# Patient Record
Sex: Male | Born: 2000 | Race: White | Hispanic: No | Marital: Single | Smoking: Never smoker
Health system: Southern US, Community
[De-identification: ages and names within clinical notes are randomized; demographics above are authoritative.]

## PROBLEM LIST (undated history)

## (undated) HISTORY — PX: OTHER SURGICAL HISTORY: SHX169

## (undated) HISTORY — PX: FRACTURE SURGERY: SHX138

---
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
## 2024-01-28 ENCOUNTER — Ambulatory Visit
Admission: RE | Admit: 2024-01-28 | Discharge: 2024-01-28 | Disposition: A | Discharge status: Home / Self Care | Source: Ambulatory Visit

## 2024-01-28 VITALS — BP 125/73 | HR 65 | Temp 98.1°F | Resp 15 | Ht 67.0 in | Wt 210.1 lb

## 2024-01-28 DIAGNOSIS — S86111A Strain of other muscle(s) and tendon(s) of posterior muscle group at lower leg level, right leg, initial encounter: Secondary | ICD-10-CM | POA: Diagnosis not present

## 2024-01-28 DIAGNOSIS — S8011XA Contusion of right lower leg, initial encounter: Secondary | ICD-10-CM | POA: Diagnosis not present

## 2024-01-28 NOTE — ED Provider Notes (Signed)
 [facility]    CSN: [id number] Arrival date & time: 01/28/24  1328      History   Chief Complaint Chief Complaint  Patient presents with   Leg Injury    Appointment    HPI Joel Shepard is a 23 y.o. male presents for bruising and swelling of the posterior medial right knee since yesterday. He reports an injury that occurred when he was working out. He states he was jumping down from pulling himself up on a pull up bar and he feet hit the bottom bar. He says within 30-60 min he noticed the pain and swelling. Symptoms are not as bad today. He is not limping. He says the area only really hurts when it is touched. Full ROM of knee. He has not treated condition.   HPI  History reviewed. No pertinent past medical history.  There are no active problems to display for this patient.   Past Surgical History:  Procedure Laterality Date   arm surgery         Home Medications    Prior to Admission medications   Medication Sig Start Date End Date Taking? Authorizing Provider  meloxicam (MOBIC) 7.5 MG tablet Take 1 tablet (7.5 mg total) by mouth daily. Do not take w/motrin alleve or excedrin 05/21/16   Desiderio Beagle, MD    Family History History reviewed. No pertinent family history.  Social History Social History   Tobacco Use   Smoking status: Never   Smokeless tobacco: Never  Vaping Use   Vaping status: Never Used  Substance Use Topics   Alcohol use: No   Drug use: No     Allergies   Patient has no known allergies.   Review of Systems Review of Systems  Musculoskeletal:  Positive for joint swelling. Negative for arthralgias and gait problem.  Skin:  Positive for color change. Negative for wound.  Neurological:  Negative for weakness and numbness.  Hematological:  Does not bruise/bleed easily.     Physical Exam Triage Vital Signs ED Triage Vitals  Encounter Vitals Group     BP 01/28/24 1351 125/73     Girls Systolic BP Percentile --       Girls Diastolic BP Percentile --      Boys Systolic BP Percentile --      Boys Diastolic BP Percentile --      Pulse Rate 01/28/24 1351 65     Resp 01/28/24 1351 15     Temp 01/28/24 1351 98.1 F (36.7 C)     Temp Source 01/28/24 1351 Oral     SpO2 01/28/24 1351 97 %     Weight 01/28/24 1349 210 lb 1.6 oz (95.3 kg)     Height 01/28/24 1349 5' 7 (1.702 m)     Head Circumference --      Peak Flow --      Pain Score 01/28/24 1349 3     Pain Loc --      Pain Education --      Exclude from Growth Chart --    No data found.  Updated Vital Signs BP 125/73 (BP Location: Left Arm)   Pulse 65   Temp 98.1 F (36.7 C) (Oral)   Resp 15   Ht 5' 7 (1.702 m)   Wt 210 lb 1.6 oz (95.3 kg)   SpO2 97%   BMI 32.91 kg/m     Physical Exam Vitals and nursing note reviewed.  Constitutional:  General: He is not in acute distress.    Appearance: Normal appearance. He is well-developed. He is not ill-appearing.  HENT:     Head: Normocephalic and atraumatic.  Eyes:     General: No scleral icterus.    Conjunctiva/sclera: Conjunctivae normal.  Cardiovascular:     Rate and Rhythm: Normal rate.  Pulmonary:     Effort: Pulmonary effort is normal. No respiratory distress.  Musculoskeletal:     Cervical back: Neck supple.     Comments: RIGHT LEG: There is mild swelling/contusion of the medial posterior knee/medial superior gastrocnemius. No bony tenderness. Full ROM of knee. No gait abnormality.   Skin:    General: Skin is warm and dry.     Capillary Refill: Capillary refill takes less than 2 seconds.  Neurological:     General: No focal deficit present.     Mental Status: He is alert. Mental status is at baseline.     Motor: No weakness.     Gait: Gait normal.  Psychiatric:        Mood and Affect: Mood normal.        Behavior: Behavior normal.      UC Treatments / Results  Labs (all labs ordered are listed, but only abnormal results are displayed) Labs Reviewed - No data to  display  EKG   Radiology No results found.  Procedures Procedures (including critical care time)  Medications Ordered in UC Medications - No data to display  Initial Impression / Assessment and Plan / UC Course  I have reviewed the triage vital signs and the nursing notes.  Pertinent labs & imaging results that were available during my care of the patient were reviewed by me and considered in my medical decision making (see chart for details).   23 year old male presents for pain and swelling as well as contusion of the medial posterior right knee.  Symptoms began after accidentally coming down hard on his feet on a metal bar while working out yesterday.  No significant swelling and no gait abnormality.  There is mild contusion and swelling of the medial posterior right knee and superior medial gastrocnemius.  Area mildly tender.  Strain of gastrocnemius muscle with contusion.  Advised cryotherapy, Tylenol, Motrin, compressive bandage.  Reviewed return precautions.   Final Clinical Impressions(s) / UC Diagnoses   Final diagnoses:  Contusion of right lower leg, initial encounter  Gastrocnemius muscle strain, right, initial encounter     Discharge Instructions      STRAIN: Stressed avoiding painful activities . Reviewed RICE guidelines. Use medications as directed, including NSAIDs. If no NSAIDs have been prescribed for you today, you may take Aleve or Motrin over the counter. May use Tylenol in between doses of NSAIDs.  If no improvement in the next 1-2 weeks, f/u with PCP or return to our office for reexamination, and please feel free to call or return at any time for any questions or concerns you may have and we will be happy to help you!        ED Prescriptions   None    PDMP not reviewed this encounter.   Arvis Jolan NOVAK, PA-C 01/28/24 1420

## 2024-01-28 NOTE — ED Triage Notes (Signed)
 Patient states that he was working up yesterday and was coming down from a pull up and his heels hit the bottom bar.  Patient states that when he was done he noticed a bulge or swelling at the back of his right knee.  Patient reports tenderness at the spot.  Patient reports full ROM.

## 2024-01-28 NOTE — Discharge Instructions (Addendum)
STRAIN: Stressed avoiding painful activities . Reviewed RICE guidelines. Use medications as directed, including NSAIDs. If no NSAIDs have been prescribed for you today, you may take Aleve or Motrin over the counter. May use Tylenol in between doses of NSAIDs.  If no improvement in the next 1-2 weeks, f/u with PCP or return to our office for reexamination, and please feel free to call or return at any time for any questions or concerns you may have and we will be happy to help you!     

## 2024-03-14 DIAGNOSIS — L700 Acne vulgaris: Secondary | ICD-10-CM | POA: Diagnosis not present

## 2024-03-14 DIAGNOSIS — L853 Xerosis cutis: Secondary | ICD-10-CM | POA: Diagnosis not present

## 2024-03-14 DIAGNOSIS — D485 Neoplasm of uncertain behavior of skin: Secondary | ICD-10-CM | POA: Diagnosis not present

## 2024-03-14 DIAGNOSIS — L720 Epidermal cyst: Secondary | ICD-10-CM | POA: Diagnosis not present

## 2024-04-18 ENCOUNTER — Ambulatory Visit: Admitting: Family Medicine

## 2024-04-18 VITALS — BP 114/76 | HR 81 | Resp 16 | Ht 67.0 in | Wt 205.0 lb

## 2024-04-18 DIAGNOSIS — M25531 Pain in right wrist: Secondary | ICD-10-CM

## 2024-04-18 DIAGNOSIS — S83002S Unspecified subluxation of left patella, sequela: Secondary | ICD-10-CM | POA: Diagnosis not present

## 2024-04-18 DIAGNOSIS — Z7689 Persons encountering health services in other specified circumstances: Secondary | ICD-10-CM

## 2024-04-18 NOTE — Patient Instructions (Addendum)
 [facility] [address] [place], [place] [address] Phone: [phone]   MyChart:  For all urgent or time sensitive needs we ask that you please call the office to avoid delays. Our number is [phone]) [phone]. MyChart is not constantly monitored and due to the large volume of messages a day, replies may take up to 72 business hours.   MyChart Policy: MyChart allows for you to see your visit notes, after visit summary, provider recommendations, lab and tests results, make an appointment, request refills, and contact your provider or the office for non-urgent questions or concerns. Providers are seeing patients during normal business hours and do not have built in time to review MyChart messages.  We ask that you allow a minimum of 3 business days for responses to [place]. For this reason, please do not send urgent requests through MyChart. Please call the office at [phone]. New and ongoing conditions may require a visit. We have virtual and in person visit available for your convenience.  Complex MyChart concerns may require a visit. Your provider may request you schedule a virtual or in person visit to ensure we are providing the best care possible. MyChart messages sent after 11:00 AM on Friday will not be received by the provider until Monday morning.    Lab and Test Results: You will receive your lab and test results on MyChart as soon as they are completed and results have been sent by the lab or testing facility. Due to this service, you will receive your results BEFORE your provider.  I review lab and tests results each morning prior to seeing patients. Some results require collaboration with other providers to ensure you are receiving the most appropriate care. For this reason, we ask that you please allow a minimum of 3-5 business days from the time the ALL results have been received for your provider to receive and review lab and test results and  contact you about these.  Most lab and test result comments from the provider will be sent through MyChart. Your provider may recommend changes to the plan of care, follow-up visits, repeat testing, ask questions, or request an office visit to discuss these results. You may reply directly to this message or call the office at [phone] to provide information for the provider or set up an appointment. In some instances, you will be called with test results and recommendations. Please let us  know if this is preferred and we will make note of this in your chart to provide this for you.    If you have not heard a response to your lab or test results in 5 business days from all results returning to MyChart, please call the office to let us  know. We ask that you please avoid calling prior to this time unless there is an emergent concern. Due to high call volumes, this can delay the resulting process.   After Hours: For all non-emergency after hours needs, please call the office at [phone] and select the option to reach the on-call provider service. On-call services are shared between multiple [facility] offices and therefore it will not be possible to speak directly with your provider. On-call providers may provide medical advice and recommendations, but are unable to provide refills for maintenance medications.  For all emergency or urgent medical needs after normal business hours, we recommend that you seek care at the closest Urgent Care or Emergency Department to ensure appropriate treatment in a timely  manner.  [facility] has a 24 hour emergency room located on the ground floor for your convenience.    Urgent Concerns During the Business Day Providers are seeing patients from 8AM to 5PM, Monday through Thursday, and 8AM to 12PM on Friday with a busy schedule and are most often not able to respond to non-urgent calls until the end of the day or the next business day. If  you should have URGENT concerns during the day, please call and speak to the nurse or schedule a same day appointment so that we can address your concern without delay.    Thank you, again, for choosing me as your health care partner. I appreciate your trust and look forward to learning more about you.    Evalene Arts, FNP-C

## 2024-04-18 NOTE — Progress Notes (Addendum)
 New Patient Office Visit  Subjective   Patient ID: Joel Shepard, male    DOB: Oct 30, 2000  Age: 23 y.o. MRN: [id number]  CC:  Chief Complaint  Patient presents with   Establish Care    Left Knee Cap pops out of place since childhood and has always been weak. Never did PT and now having more popping and feels knot when he stands. Had fluid in past as well.  Right Wrist injury when his knee popped out of place causing a fall a few weeks ago- Never got it checked.   Discussed the use of AI scribe software for clinical note transcription with the patient, who gave verbal consent to proceed.  History of Present Illness   Joel Shepard is a 23 year old male who presents to establish with [facility] at [facility].  He has concerns about his left knee and recent right wrist pain.  He has a history of left knee issues that began in eighth grade, around the age of 3. In 2019, he sustained a fracture of the middle part of his left patella but did not undergo physical therapy. Review of note- looks like he was diagnosed with subacute fracture of the medial patella associated with contusion of the lateral femoral condyle. The knee had been stable for the past year to year and a half until a recent incident while playing tennis, during which his patella dislocated and then relocated, causing him to fall. He notes swelling after such incidents, with the first occurrence being particularly severe. He experiences a sensation of effusion and occasional crepitus, especially when squatting. No current pain, but bending and squatting were painful immediately after the incident.  Following the fall during tennis, he experienced pain in his right wrist, which he used to catch himself. Initially, the wrist felt fine, but pain developed three to four hours later, particularly near the thumb. The pain is exacerbated by certain movements, such as extending the thumb, and he notes difficulty using a  pull-up bar due to the discomfort. He rates the pain as mild today, indicating improvement. He has not used ice or anti-inflammatory medications for the wrist pain. Reports pain in the right wrist, particularly near the thumb, with certain movements.     Outpatient Encounter Medications as of 04/18/2024  Medication Sig   [DISCONTINUED] doxycycline (ADOXA) 100 MG tablet Take 100 mg by mouth 2 (two) times daily.   [DISCONTINUED] meloxicam  (MOBIC ) 7.5 MG tablet Take 1 tablet (7.5 mg total) by mouth daily. Do not take w/motrin alleve or excedrin (Patient not taking: Reported on 04/18/2024)   No facility-administered encounter medications on file as of 04/18/2024.   There are no active problems to display for this patient.  History reviewed. No pertinent past medical history. Past Surgical History:  Procedure Laterality Date   arm surgery     FRACTURE SURGERY     Family History  Problem Relation Age of Onset   ADD / ADHD Mother    Cancer Mother    Diabetes Mother    Cancer Maternal Grandmother    ADD / ADHD Sister    Diabetes Maternal Uncle    Social History   Socioeconomic History   Marital status: Single    Spouse name: Not on file   Number of children: Not on file   Years of education: Not on file   Highest education level: 12th grade  Occupational History   Not on file  Tobacco Use  Smoking status: Never    Passive exposure: Never   Smokeless tobacco: Never  Vaping Use   Vaping status: Never Used  Substance and Sexual Activity   Alcohol use: No   Drug use: No   Sexual activity: Not Currently    Birth control/protection: Implant    Comment: she had an implant  Other Topics Concern   Not on file  Social History Narrative   ** Merged History Encounter **       Social Drivers of Health   Financial Resource Strain: Medium Risk (04/18/2024)   Overall Financial Resource Strain (CARDIA)    Difficulty of Paying Living Expenses: Somewhat hard  Food Insecurity: No Food  Insecurity (04/18/2024)   Hunger Vital Sign    Worried About Running Out of Food in the Last Year: Never true    Joel Out of Food in the Last Year: Never true  Transportation Needs: No Transportation Needs (04/18/2024)   PRAPARE - Administrator, Civil Service (Medical): No    Lack of Transportation (Non-Medical): No  Physical Activity: Sufficiently Active (04/18/2024)   Exercise Vital Sign    Days of Exercise per Week: 5 days    Minutes of Exercise per Session: 90 min  Stress: No Stress Concern Present (04/18/2024)   [employer] of Occupational Health - Occupational Stress Questionnaire    Feeling of Stress: Not at all  Social Connections: Socially Isolated (04/18/2024)   Social Connection and Isolation Panel    Frequency of Communication with Friends and Family: Three times a week    Frequency of Social Gatherings with Friends and Family: Twice a week    Attends Religious Services: Never    Database Administrator or Organizations: No    Attends Engineer, Structural: Not on file    Marital Status: Never married  Intimate Partner Violence: Not on file   Outpatient Medications Prior to Visit  Medication Sig Dispense Refill   doxycycline (ADOXA) 100 MG tablet Take 100 mg by mouth 2 (two) times daily.     meloxicam  (MOBIC ) 7.5 MG tablet Take 1 tablet (7.5 mg total) by mouth daily. Do not take w/motrin alleve or excedrin (Patient not taking: Reported on 04/18/2024) 30 tablet 0   No facility-administered medications prior to visit.   No Known Allergies  ROS: see HPI    Objective   Today's Vitals   04/18/24 1045  BP: 114/76  Pulse: 81  Resp: 16  SpO2: 99%  Weight: 205 lb (93 kg)  Height: 5' 7 (1.702 m)  PainSc: 5   PainLoc: Wrist   Physical Exam Vitals reviewed.  Constitutional:      Appearance: Normal appearance.  Cardiovascular:     Rate and Rhythm: Normal rate and regular rhythm.     Pulses: Normal pulses.     Heart sounds: Normal heart sounds.   Pulmonary:     Effort: Pulmonary effort is normal.     Breath sounds: Normal breath sounds.  Musculoskeletal:     Right wrist: Tenderness present. No swelling, deformity, effusion, lacerations, bony tenderness, snuff box tenderness or crepitus. Decreased range of motion. Normal pulse.     Left wrist: Normal.     Right knee: Crepitus present.     Left knee: Crepitus present. No swelling, deformity, effusion, erythema, ecchymosis, lacerations or bony tenderness. Normal range of motion. No tenderness. Normal alignment, normal meniscus and normal patellar mobility.  Neurological:     Mental Status: He is alert.  Psychiatric:  Mood and Affect: Mood normal.        Behavior: Behavior normal.     Assessment & Plan:   1. Encounter to establish care (Primary) Patient is a 46- year-old male who presents today to establish care with primary care at [facility]. Reviewed the past medical history, family history, social history, surgical history, medications and allergies today- updates made as indicated. Patient has concerns today about left knee and right wrist pain.   2. Patellar subluxation, left, sequela Chronic instability with recent dislocation, history of 2019 fracture, no prior physical therapy. Physical exam with no swelling, redness, warmth or deformity present of left knee. No tenderness noted along medial and lateral joint line. No effusion present. Flexion and extension not limited and without pain. Crepitus noted bilaterally with squatting. Neurovascular exam intact. Ordered x-ray of left knee. Referred to orthopedics for further evaluation and management.  - DG Knee Complete 4 Views Left; Future - Ambulatory referral to Orthopedic Surgery  3. Acute pain of right wrist Acute pain post-fall, likely minor fracture due to injury mechanism or possible tenosynovitis. Physical exam without redness, warmth or swelling over right radial wrist, no deformity. Tenderness to palpation over  radial styloid. Pain on thumb abduction and wrist ulnar deviation. Neurovascular exam intact. Ordered x-ray of right wrist. Advised ice application and NSAID use for pain management. - DG Wrist Complete Right; Future - Ambulatory referral to Orthopedic Surgery    Return in about 3 months (around 07/19/2024) for Physical with fasting labs.   Evalene Arts, FNP

## 2024-04-19 ENCOUNTER — Ambulatory Visit
Admission: RE | Admit: 2024-04-19 | Discharge: 2024-04-19 | Disposition: A | Discharge status: Home / Self Care | Attending: Family Medicine | Admitting: Family Medicine

## 2024-04-19 ENCOUNTER — Ambulatory Visit
Admission: RE | Admit: 2024-04-19 | Discharge: 2024-04-19 | Disposition: A | Discharge status: Home / Self Care | Source: Ambulatory Visit | Attending: Family Medicine | Admitting: Family Medicine

## 2024-04-19 DIAGNOSIS — M25531 Pain in right wrist: Secondary | ICD-10-CM | POA: Diagnosis not present

## 2024-04-19 DIAGNOSIS — G8929 Other chronic pain: Secondary | ICD-10-CM | POA: Diagnosis not present

## 2024-04-19 DIAGNOSIS — M25562 Pain in left knee: Secondary | ICD-10-CM | POA: Diagnosis not present

## 2024-04-19 DIAGNOSIS — S83002S Unspecified subluxation of left patella, sequela: Secondary | ICD-10-CM

## 2024-04-24 DIAGNOSIS — S83002A Unspecified subluxation of left patella, initial encounter: Secondary | ICD-10-CM | POA: Diagnosis not present

## 2024-04-25 ENCOUNTER — Ambulatory Visit: Payer: Self-pay | Admitting: Family Medicine

## 2024-05-24 ENCOUNTER — Encounter: Payer: Self-pay | Admitting: Family Medicine

## 2024-05-24 ENCOUNTER — Ambulatory Visit: Admitting: Family Medicine

## 2024-05-24 VITALS — BP 121/78 | HR 91 | Resp 16 | Ht 67.0 in | Wt 209.0 lb

## 2024-05-24 DIAGNOSIS — R220 Localized swelling, mass and lump, head: Secondary | ICD-10-CM

## 2024-05-24 NOTE — Patient Instructions (Addendum)
 [facility] [address] [place],  [place]  [address] [phone]

## 2024-05-24 NOTE — Progress Notes (Signed)
 Acute Care Office Visit  Subjective:   Joel Shepard 2000/07/16 05/24/2024  Chief Complaint  Patient presents with   Mass    Lump on Left sideof head noticed last week. No pain.    Discussed the use of AI scribe software for clinical note transcription with the patient, who gave verbal consent to proceed.  History of Present Illness   Joel Shepard is a 23 year old male who presents with a lump on the left side of his head.  Approximately one week ago, he noticed a lump on the left side of his head. He has not observed any similar lumps elsewhere on his body. No recent injuries or illnesses. He does not shave his head, which could potentially cause irritation or lumps. Denies pain, redness, or presence of other lumps.      The following portions of the patient's history were reviewed and updated as appropriate: past medical history, past surgical history, family history, social history, allergies, medications, and problem list.   There are no active problems to display for this patient.  History reviewed. No pertinent past medical history. Past Surgical History:  Procedure Laterality Date   arm surgery     FRACTURE SURGERY     Family History  Problem Relation Age of Onset   ADD / ADHD Mother    Cancer Mother    Diabetes Mother    Cancer Maternal Grandmother    ADD / ADHD Sister    Diabetes Maternal Uncle    Outpatient Medications Prior to Visit  Medication Sig Dispense Refill   doxycycline (ADOXA) 100 MG tablet Take 100 mg by mouth 2 (two) times daily.     No facility-administered medications prior to visit.   No Known Allergies  ROS: A complete ROS was performed with pertinent positives/negatives noted in the HPI. The remainder of the ROS are negative.    Objective:   Today's Vitals   05/24/24 0813  BP: 121/78  Pulse: 91  Resp: 16  Weight: 209 lb (94.8 kg)  Height: 5' 7 (1.702 m)  PainSc: 0-No pain   Physical Exam Vitals reviewed.   Constitutional:      Appearance: Normal appearance.  HENT:     Head:      Comments: 1cm moveable mass present near L temple region Cardiovascular:     Rate and Rhythm: Normal rate and regular rhythm.     Pulses: Normal pulses.     Heart sounds: Normal heart sounds.  Pulmonary:     Effort: Pulmonary effort is normal.     Breath sounds: Normal breath sounds.  Lymphadenopathy:     Cervical: No cervical adenopathy.     Right cervical: No superficial, deep or posterior cervical adenopathy.    Left cervical: No superficial, deep or posterior cervical adenopathy.  Neurological:     Mental Status: He is alert.  Psychiatric:        Mood and Affect: Mood normal.        Behavior: Behavior normal.      Assessment & Plan:   1. Localized swelling, mass, and lump of head (Primary) Localized swelling, mass, and lump of head. Mobile, non-tender lump on left head likely a fluid-filled cyst. Ordered ultrasound of left head mass. Provided contact information for ultrasound scheduling. Advised ultrasound may be unnecessary if lump resolves. Discussed potential general surgery referral for drainage if possible cyst persists, increases in size or becomes painful. Will update patient with results from ultrasound.  -  US  Soft Tissue Head/Neck (NON-THYROID); Future   Return if symptoms worsen or fail to improve.    Patient to reach out to office if new, worrisome, or unresolved symptoms arise or if no improvement in patient's condition. Patient verbalized understanding and is agreeable to treatment plan. All questions answered to patient's satisfaction.    Evalene Arts, FNP

## 2024-05-29 ENCOUNTER — Ambulatory Visit: Admission: RE | Admit: 2024-05-29 | Discharge: 2024-05-29 | Attending: Family Medicine

## 2024-05-29 DIAGNOSIS — R220 Localized swelling, mass and lump, head: Secondary | ICD-10-CM | POA: Insufficient documentation

## 2024-06-05 ENCOUNTER — Ambulatory Visit: Payer: Self-pay | Admitting: Family Medicine

## 2024-07-12 ENCOUNTER — Ambulatory Visit

## 2024-07-12 ENCOUNTER — Other Ambulatory Visit: Payer: Self-pay

## 2024-07-12 DIAGNOSIS — Z0000 Encounter for general adult medical examination without abnormal findings: Secondary | ICD-10-CM

## 2024-07-13 ENCOUNTER — Ambulatory Visit: Payer: Self-pay | Admitting: Family Medicine

## 2024-07-13 LAB — CBC WITH DIFFERENTIAL/PLATELET
Basophils Absolute: 0 10*3/uL (ref 0.0–0.2)
Basos: 1 %
EOS (ABSOLUTE): 0.1 10*3/uL (ref 0.0–0.4)
Eos: 3 %
Hematocrit: 47 % (ref 37.5–51.0)
Hemoglobin: 15.7 g/dL (ref 13.0–17.7)
Immature Grans (Abs): 0 10*3/uL (ref 0.0–0.1)
Immature Granulocytes: 0 %
Lymphocytes Absolute: 1.9 10*3/uL (ref 0.7–3.1)
Lymphs: 39 %
MCH: 29.9 pg (ref 26.6–33.0)
MCHC: 33.4 g/dL (ref 31.5–35.7)
MCV: 90 fL (ref 79–97)
Monocytes Absolute: 0.5 10*3/uL (ref 0.1–0.9)
Monocytes: 10 %
Neutrophils Absolute: 2.3 10*3/uL (ref 1.4–7.0)
Neutrophils: 47 %
Platelets: 195 10*3/uL (ref 150–450)
RBC: 5.25 x10E6/uL (ref 4.14–5.80)
RDW: 12.8 % (ref 11.6–15.4)
WBC: 4.8 10*3/uL (ref 3.4–10.8)

## 2024-07-13 LAB — COMPREHENSIVE METABOLIC PANEL WITH GFR
ALT: 20 [IU]/L (ref 0–44)
AST: 18 [IU]/L (ref 0–40)
Albumin: 4.5 g/dL (ref 4.3–5.2)
Alkaline Phosphatase: 96 [IU]/L (ref 47–123)
BUN/Creatinine Ratio: 13 (ref 9–20)
BUN: 10 mg/dL (ref 6–20)
Bilirubin Total: 0.6 mg/dL (ref 0.0–1.2)
CO2: 25 mmol/L (ref 20–29)
Calcium: 9.2 mg/dL (ref 8.7–10.2)
Chloride: 102 mmol/L (ref 96–106)
Creatinine, Ser: 0.76 mg/dL (ref 0.76–1.27)
Globulin, Total: 2.3 g/dL (ref 1.5–4.5)
Glucose: 86 mg/dL (ref 70–99)
Potassium: 4.1 mmol/L (ref 3.5–5.2)
Sodium: 142 mmol/L (ref 134–144)
Total Protein: 6.8 g/dL (ref 6.0–8.5)
eGFR: 130 mL/min/{1.73_m2}

## 2024-07-13 LAB — LIPID PANEL
Chol/HDL Ratio: 3.9 ratio (ref 0.0–5.0)
Cholesterol, Total: 185 mg/dL (ref 100–199)
HDL: 47 mg/dL
LDL Chol Calc (NIH): 125 mg/dL — ABNORMAL HIGH (ref 0–99)
Triglycerides: 71 mg/dL (ref 0–149)
VLDL Cholesterol Cal: 13 mg/dL (ref 5–40)

## 2024-07-13 LAB — HEMOGLOBIN A1C
Est. average glucose Bld gHb Est-mCnc: 105 mg/dL
Hgb A1c MFr Bld: 5.3 % (ref 4.8–5.6)

## 2024-07-13 LAB — TSH RFX ON ABNORMAL TO FREE T4: TSH: 1.28 u[IU]/mL (ref 0.450–4.500)

## 2024-07-19 ENCOUNTER — Encounter: Payer: Self-pay | Admitting: Family Medicine

## 2024-07-19 ENCOUNTER — Ambulatory Visit (INDEPENDENT_AMBULATORY_CARE_PROVIDER_SITE_OTHER): Admitting: Family Medicine

## 2024-07-19 VITALS — BP 116/74 | HR 76 | Temp 98.0°F | Resp 16 | Ht 68.5 in | Wt 209.8 lb

## 2024-07-19 DIAGNOSIS — Z0000 Encounter for general adult medical examination without abnormal findings: Secondary | ICD-10-CM | POA: Diagnosis not present

## 2024-07-19 DIAGNOSIS — Z23 Encounter for immunization: Secondary | ICD-10-CM

## 2024-07-19 NOTE — Patient Instructions (Signed)
 Health Maintenance Recommendations Screening Testing Colon Cancer Screening Every 1-10 years based on test performed, risk factors, and history Starting at age 22 Bone Density Screening Every 2-10 years based on history Starting at age 89 for women Recommendations for men differ based on medication usage, history, and risk factors AAA Screening One time ultrasound Men 64-41 years old who have every smoked Lung Cancer Screening Low Dose Lung CT every 12 months Age 37-80 years with a 30 pack-year smoking history who still smoke or who have quit within the last 15 years   Screening Labs Routine  Labs: Complete Blood Count (CBC), Complete Metabolic Panel (CMP), Cholesterol (Lipid Panel) Every 6-12 months based on history and medications May be recommended more frequently based on current conditions or previous results Hemoglobin A1c Lab Every 3-12 months based on history and previous results Starting at age 63 or earlier with diagnosis of diabetes, high cholesterol, BMI >26, and/or risk factors Frequent monitoring for patients with diabetes to ensure blood sugar control Thyroid Panel (TSH w/ T3 & T4) Every 6 months based on history, symptoms, and risk factors May be repeated more often if on medication HIV One time testing for all patients 48 and older May be repeated more frequently for patients with increased risk factors or exposure Hepatitis C One time testing for all patients 57 and older May be repeated more frequently for patients with increased risk factors or exposure Gonorrhea, Chlamydia Every 12 months for all sexually active persons 13-24 years Additional monitoring may be recommended for those who are considered high risk or who have symptoms PSA Men 48-51 years old with risk factors Additional screening may be recommended from age 26-69 based on risk factors, symptoms, and history   Vaccine Recommendations Tetanus Booster All adults every 10 years Flu Vaccine All  patients 6 months and older every year COVID Vaccine All patients 12 years and older Initial dosing with booster May recommend additional booster based on age and health history HPV Vaccine 2 doses all patients age 32-26 Dosing may be considered for patients over 26 Shingles Vaccine (Shingrix) 2 doses all adults 55 years and older Pneumonia (Pneumovax 23) All adults 65 years and older May recommend earlier dosing based on health history Pneumonia (Prevnar 9) All adults 65 years and older Dosed 1 year after Pneumovax 23   Additional Screening, Testing, and Vaccinations may be recommended on an individualized basis based on family history, health history, risk factors, and/or exposure.  __________________________________________________________   Diet Recommendations for All Patients   I recommend that all patients maintain a diet low in saturated fats, carbohydrates, and cholesterol. While this can be challenging at first, it is not impossible and small changes can make big differences.  Things to try: Decreasing the amount of soda, sweet tea, and/or juice to one or less per day and replace with water While water is always the first choice, if you do not like water you may consider adding a water additive without sugar to improve the taste other sugar free drinks Replace potatoes with a brightly colored vegetable at dinner Use healthy oils, such as canola oil or olive oil, instead of butter or hard margarine Limit your bread intake to two pieces or less a day Replace regular pasta with low carb pasta options Bake, broil, or grill foods instead of frying Monitor portion sizes  Eat smaller, more frequent meals throughout the day instead of large meals   An important thing to remember is, if you love foods that  are not great for your health, you don't have to give them up completely. Instead, allow these foods to be a reward when you have done well. Allowing yourself to still have  special treats every once in a while is a nice way to tell yourself thank you for working hard to keep yourself healthy.    Also remember that every day is a new day. If you have a bad day and fall off the wagon, you can still climb right back up and keep moving along on your journey!   We have resources available to help you!  Some websites that may be helpful include: www.http://www.wall-moore.info/        Www.VeryWellFit.com _____________________________________________________________   Activity Recommendations for All Patients   I recommend that all adults get at least 20 minutes of moderate physical activity that elevates your heart rate at least 5 days out of the week.  Some examples include: Walking or jogging at a pace that allows you to carry on a conversation Cycling (stationary bike or outdoors) Water aerobics Yoga Weight lifting Dancing If physical limitations prevent you from putting stress on your joints, exercise in a pool or seated in a chair are excellent options.   Do determine your MAXIMUM heart rate for activity: YOUR AGE - 220 = MAX HeartRate    Remember! Do not push yourself too hard.  Start slowly and build up your pace, speed, weight, time in exercise, etc.  Allow your body to rest between exercise and get good sleep. You will need more water than normal when you are exerting yourself. Do not wait until you are thirsty to drink. Drink with a purpose of getting in at least 8, 8 ounce glasses of water a day plus more depending on how much you exercise and sweat.      If you begin to develop dizziness, chest pain, abdominal pain, jaw pain, shortness of breath, headache, vision changes, lightheadedness, or other concerning symptoms, stop the activity and allow your body to rest. If your symptoms are severe, seek emergency evaluation immediately. If your symptoms are concerning, but not severe, please let us  know so that we can recommend further evaluation.

## 2024-07-19 NOTE — Progress Notes (Signed)
 "  Subjective:   Joel Shepard [birth year] 07/19/2024  CC: Chief Complaint  Patient presents with   Annual Exam    Annual exam.    HPI: Joel Shepard is a 24 y.o. male who presents for a routine health maintenance exam. Discussed fasting labs previously collected.  Diet- not good  Exercise- active   HEALTH SCREENINGS: - Vision Screening: received an eye exam about 1-2 years ago - Dental Visits: Q6 months for cleanings  - Testicular Exam: Declined - STD Screening: Declined - PSA (50+): Not applicable  - Colonoscopy (45+): Not applicable  Discussed with patient purpose of the colonoscopy is to detect colon cancer at curable precancerous or early stages  - AAA Screening: Not applicable  Men age 11-75 who have ever smoked - Lung Cancer screening with low-dose CT: Not applicable-  Adults age 46-80 who are current cigarette smokers or quit within the last 15 years. Must have 20 pack year history.   Depression and Anxiety Screen done today and results listed below:     07/19/2024    8:09 AM 05/24/2024    8:23 AM 04/18/2024   10:47 AM  Depression screen PHQ 2/9  Decreased Interest 0 0 0  Down, Depressed, Hopeless 0 0 0  PHQ - 2 Score 0 0 0  Altered sleeping 0 0 0  Tired, decreased energy 0 0 0  Change in appetite 0 0 1  Feeling bad or failure about yourself  0 0 0  Trouble concentrating 0 0 0  Moving slowly or fidgety/restless 0 0 0  Suicidal thoughts 0 0 0  PHQ-9 Score 0 0 1   Difficult doing work/chores Not difficult at all Not difficult at all Not difficult at all     Data saved with a previous flowsheet row definition      07/19/2024    8:09 AM 05/24/2024    8:22 AM 04/18/2024   10:47 AM  GAD 7 : Generalized Anxiety Score  Nervous, Anxious, on Edge 0 0  0   Control/stop worrying 0 0  0   Worry too much - different things 0 0  0   Trouble relaxing 0 0  0   Restless 0 0  0   Easily annoyed or irritable 0 0  0   Afraid - awful might happen 0 0  0   Total  GAD 7 Score 0 0 0  Anxiety Difficulty Not difficult at all Not difficult at all Not difficult at all     Data saved with a previous flowsheet row definition   IMMUNIZATIONS:  - Tdap: Tetanus vaccination status reviewed: Td vaccination indicated and given today. - Influenza: Refused - Pneumovax: Not applicable - Prevnar: Not applicable - Shingrix vaccine (50+): Not applicable  Past medical history, surgical history, medications, allergies, family history and social history reviewed with patient today and changes made to appropriate areas of the chart.   History reviewed. No pertinent past medical history.  Past Surgical History:  Procedure Laterality Date   arm surgery     FRACTURE SURGERY      No current outpatient medications on file prior to visit.   No current facility-administered medications on file prior to visit.   Allergies[1]  Social History   Socioeconomic History   Marital status: Single    Spouse name: Not on file   Number of children: 0   Years of education: Not on file   Highest education level: 12th grade  Occupational History  Not on file  Tobacco Use   Smoking status: Never    Passive exposure: Never   Smokeless tobacco: Never  Vaping Use   Vaping status: Never Used  Substance and Sexual Activity   Alcohol use: Yes    Comment: occasionally   Drug use: Not Currently   Sexual activity: Yes    Birth control/protection: Implant    Comment: she had an implant  Other Topics Concern   Not on file  Social History Narrative   ** Merged History Encounter **       Social Drivers of Health   Tobacco Use: Low Risk (07/19/2024)   Patient History    Smoking Tobacco Use: Never    Smokeless Tobacco Use: Never    Passive Exposure: Never  Financial Resource Strain: Medium Risk (04/18/2024)   Overall Financial Resource Strain (CARDIA)    Difficulty of Paying Living Expenses: Somewhat hard  Food Insecurity: No Food Insecurity (04/18/2024)   Epic     Worried About Programme Researcher, Broadcasting/film/video in the Last Year: Never true    Ran Out of Food in the Last Year: Never true  Transportation Needs: No Transportation Needs (04/18/2024)   Epic    Lack of Transportation (Medical): No    Lack of Transportation (Non-Medical): No  Physical Activity: Sufficiently Active (04/18/2024)   Exercise Vital Sign    Days of Exercise per Week: 5 days    Minutes of Exercise per Session: 90 min  Stress: No Stress Concern Present (04/18/2024)   [employer] of Occupational Health - Occupational Stress Questionnaire    Feeling of Stress: Not at all  Social Connections: Socially Isolated (04/18/2024)   Social Connection and Isolation Panel    Frequency of Communication with Friends and Family: Three times a week    Frequency of Social Gatherings with Friends and Family: Twice a week    Attends Religious Services: Never    Database Administrator or Organizations: No    Attends Engineer, Structural: Not on file    Marital Status: Never married  Intimate Partner Violence: Not At Risk (07/19/2024)   Epic    Fear of Current or Ex-Partner: No    Emotionally Abused: No    Physically Abused: No    Sexually Abused: No  Depression (PHQ2-9): Low Risk (07/19/2024)   Depression (PHQ2-9)    PHQ-2 Score: 0  Alcohol Screen: Low Risk (04/18/2024)   Alcohol Screen    Last Alcohol Screening Score (AUDIT): 3  Housing: Low Risk (04/18/2024)   Epic    Unable to Pay for Housing in the Last Year: No    Number of Times Moved in the Last Year: 0    Homeless in the Last Year: No  Utilities: Not At Risk (07/19/2024)   Epic    Threatened with loss of utilities: No  Health Literacy: Not on file   Tobacco Use History[2] Social History   Substance and Sexual Activity  Alcohol Use Yes   Comment: occasionally   Family History  Problem Relation Age of Onset   ADD / ADHD Mother    Cancer Mother    Diabetes Mother    Hypertension Father    ADD / ADHD Sister    Diabetes Maternal  Uncle    Cancer Maternal Grandmother    ROS: Denies fever, fatigue, unexplained weight loss/gain, CP, SHOB, and palpatitations. Denies neurological deficits, gastrointestinal and/or genitourinary complaints, and skin changes.   Objective:   Today's Vitals   07/19/24  0806  BP: 116/74  Pulse: 76  Resp: 16  Temp: 98 F (36.7 C)  TempSrc: Oral  SpO2: 96%  Weight: 209 lb 12.8 oz (95.2 kg)  Height: 5' 8.5 (1.74 m)  PainSc: 0-No pain   GENERAL APPEARANCE: Well-appearing, in NAD. Well nourished.  SKIN: Pink, warm and dry. Turgor normal. No rash, lesion, ulceration, or ecchymoses. Hair evenly distributed.  HEENT: HEAD: Normocephalic.  EYES: PERRLA. EOMI. Lids intact w/o defect. Sclera white, Conjunctiva pink w/o exudate.  EARS: External ear w/o redness, swelling, masses or lesions. EAC clear. TM's intact, translucent w/o bulging, appropriate landmarks visualized. Appropriate acuity to conversational tones.  NOSE: Septum midline w/o deformity. Nares patent, mucosa pink and non-inflamed w/o drainage. No sinus tenderness.  THROAT: Uvula midline. Oropharynx clear. Tonsils non-inflamed w/o exudate. Oral mucosa pink and moist.  NECK: Supple, Trachea midline. Full ROM w/o pain or tenderness. No lymphadenopathy. Thyroid  non-tender w/o enlargement or palpable masses.  RESPIRATORY: Chest wall symmetrical w/o masses. Respirations even and non-labored. Breath sounds clear to auscultation bilaterally. No wheezes, rales, rhonchi, or crackles. CARDIAC: S1, S2 present, regular rate and rhythm. No gallops, murmurs, rubs, or clicks. PMI w/o lifts, heaves, or thrills. No carotid bruits. Capillary refill <2 seconds. Peripheral pulses 2+ bilaterally. GI: Abdomen soft w/o distention. Normoactive bowel sounds. No palpable masses or tenderness. No guarding or rebound tenderness. Liver and spleen w/o tenderness or enlargement. No CVA tenderness.  GU: Deferred exam. MSK: Muscle tone and strength appropriate for age,  w/o atrophy or abnormal movement. EXTREMITIES: Active ROM intact, w/o tenderness, crepitus, or contracture. No obvious joint deformities or effusions. No clubbing, edema, or cyanosis.  NEUROLOGIC: CN's II-XII intact. Motor strength symmetrical with no obvious weakness. No sensory deficits. DTR 2+ symmetric bilaterally. Steady, even gait.  PSYCH/MENTAL STATUS: Alert, oriented x 3. Cooperative, appropriate mood and affect.   Results for orders placed or performed in visit on 07/12/24  CBC with Differential/Platelet   Collection Time: 07/12/24  8:11 AM  Result Value Ref Range   WBC 4.8 3.4 - 10.8 x10E3/uL   RBC 5.25 4.14 - 5.80 x10E6/uL   Hemoglobin 15.7 13.0 - 17.7 g/dL   Hematocrit 52.9 62.4 - 51.0 %   MCV 90 79 - 97 fL   MCH 29.9 26.6 - 33.0 pg   MCHC 33.4 31.5 - 35.7 g/dL   RDW 87.1 88.3 - 84.5 %   Platelets 195 150 - 450 x10E3/uL   Neutrophils 47 Not Estab. %   Lymphs 39 Not Estab. %   Monocytes 10 Not Estab. %   Eos 3 Not Estab. %   Basos 1 Not Estab. %   Neutrophils Absolute 2.3 1.4 - 7.0 x10E3/uL   Lymphocytes Absolute 1.9 0.7 - 3.1 x10E3/uL   Monocytes Absolute 0.5 0.1 - 0.9 x10E3/uL   EOS (ABSOLUTE) 0.1 0.0 - 0.4 x10E3/uL   Basophils Absolute 0.0 0.0 - 0.2 x10E3/uL   Immature Granulocytes 0 Not Estab. %   Immature Grans (Abs) 0.0 0.0 - 0.1 x10E3/uL  Comprehensive metabolic panel with GFR   Collection Time: 07/12/24  8:11 AM  Result Value Ref Range   Glucose 86 70 - 99 mg/dL   BUN 10 6 - 20 mg/dL   Creatinine, Ser 9.23 0.76 - 1.27 mg/dL   eGFR 869 >40 fO/fpw/8.26   BUN/Creatinine Ratio 13 9 - 20   Sodium 142 134 - 144 mmol/L   Potassium 4.1 3.5 - 5.2 mmol/L   Chloride 102 96 - 106 mmol/L   CO2 25  20 - 29 mmol/L   Calcium 9.2 8.7 - 10.2 mg/dL   Total Protein 6.8 6.0 - 8.5 g/dL   Albumin 4.5 4.3 - 5.2 g/dL   Globulin, Total 2.3 1.5 - 4.5 g/dL   Bilirubin Total 0.6 0.0 - 1.2 mg/dL   Alkaline Phosphatase 96 47 - 123 IU/L   AST 18 0 - 40 IU/L   ALT 20 0 - 44 IU/L   Hemoglobin A1c   Collection Time: 07/12/24  8:11 AM  Result Value Ref Range   Hgb A1c MFr Bld 5.3 4.8 - 5.6 %   Est. average glucose Bld gHb Est-mCnc 105 mg/dL  Lipid panel   Collection Time: 07/12/24  8:11 AM  Result Value Ref Range   Cholesterol, Total 185 100 - 199 mg/dL   Triglycerides 71 0 - 149 mg/dL   HDL 47 >60 mg/dL   VLDL Cholesterol Cal 13 5 - 40 mg/dL   LDL Chol Calc (NIH) 874 (H) 0 - 99 mg/dL   Chol/HDL Ratio 3.9 0.0 - 5.0 ratio  TSH Rfx on Abnormal to Free T4   Collection Time: 07/12/24  8:11 AM  Result Value Ref Range   TSH 1.280 0.450 - 4.500 uIU/mL    Assessment & Plan:   1. Wellness examination (Primary) - Encouraged to adjust caloric intake to maintain or achieve ideal body weight, to reduce intake of dietary saturated fat and total fat, to limit sodium intake by avoiding high sodium foods and not adding table salt, and to maintain adequate dietary potassium and calcium preferably from fresh fruits, vegetables, and low-fat dairy products.   - Advised to avoid cigarette smoking. - Discussed with the patient that most people either abstain from alcohol or drink within safe limits (<=14/week and <=4 drinks/occasion for males, <=7/weeks and <= 3 drinks/occasion for females) and that the risk for alcohol disorders and other health effects rises proportionally with the number of drinks per week and how often a drinker exceeds daily limits. - Discussed cessation/primary prevention of drug use and availability of treatment for abuse.  - Stressed the importance of regular exercise - Injury prevention: Discussed safety belts, safety helmets, smoke detector, smoking near bedding or upholstery.  - Dental health: Discussed importance of regular tooth brushing, flossing, and dental visits.  - Sexuality: Discussed sexually transmitted diseases, partner selection, use of condoms, avoidance of unintended pregnancy  and contraceptive alternatives.  NEXT PREVENTATIVE PHYSICAL DUE IN  1 YEAR.  2. Need for diphtheria-tetanus-pertussis (Tdap) vaccine Patient agreeable to receive Tdap immunization today. VIS provided within online AVS.    Return in about 1 year (around 07/19/2025) for Physical with fasting labs.  Patient to reach out to office if new, worrisome, or unresolved symptoms arise or if no improvement in patient's condition. Patient verbalized understanding and is agreeable to treatment plan. All questions answered to patient's satisfaction.    Evalene Arts, FNP     [1] No Known Allergies [2]  Social History Tobacco Use  Smoking Status Never   Passive exposure: Never  Smokeless Tobacco Never   "

## 2025-07-12 ENCOUNTER — Ambulatory Visit

## 2025-07-20 ENCOUNTER — Encounter: Admitting: Family Medicine
